# Patient Record
Sex: Female | Born: 1985 | Hispanic: Yes | Marital: Married | Smoking: Never smoker
Health system: Southern US, Community
[De-identification: ages and names within clinical notes are randomized; demographics above are authoritative.]

## PROBLEM LIST (undated history)

## (undated) DIAGNOSIS — N6452 Nipple discharge: Secondary | ICD-10-CM

## (undated) DIAGNOSIS — N6019 Diffuse cystic mastopathy of unspecified breast: Secondary | ICD-10-CM

## (undated) HISTORY — PX: BREAST BIOPSY: SHX20

---
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
## 2006-04-15 ENCOUNTER — Inpatient Hospital Stay: Payer: Self-pay | Admitting: Obstetrics and Gynecology

## 2006-05-17 ENCOUNTER — Emergency Department: Payer: Self-pay | Admitting: Unknown Physician Specialty

## 2008-05-25 ENCOUNTER — Emergency Department: Payer: Self-pay | Admitting: Emergency Medicine

## 2015-10-05 ENCOUNTER — Other Ambulatory Visit: Payer: Self-pay

## 2015-10-05 ENCOUNTER — Ambulatory Visit
Admission: RE | Admit: 2015-10-05 | Discharge: 2015-10-05 | Disposition: A | Discharge status: Home / Self Care | Payer: Self-pay | Source: Ambulatory Visit | Attending: Oncology | Admitting: Oncology

## 2015-10-05 ENCOUNTER — Ambulatory Visit: Payer: Self-pay | Attending: Oncology

## 2015-10-05 VITALS — BP 127/72 | HR 82 | Temp 98.6°F | Resp 18 | Ht 64.96 in | Wt 128.3 lb

## 2015-10-05 DIAGNOSIS — N63 Unspecified lump in unspecified breast: Secondary | ICD-10-CM

## 2015-10-05 NOTE — Progress Notes (Signed)
Subjective:     Patient ID: Sally Liu, female   DOB: 02/08/1986, 30 y.o.   MRN: [id number]  HPI   Review of Systems     Objective:   Physical Exam  Pulmonary/Chest:    Firm, mobile 2cm. mass at 1 o'clock       Assessment:     30 year old female referred to [facility] by  Nurse practitioner at [facility] for right breast mass.  Patient's mother passed away 2 months ago from triple negative, BRCA negative breast cancer.  Patient states she felt lump 1-2 weeks ago.   CBE reveals firm, mobile mass at 1 o'clock right breast.  Bilateral thickening upper outer quadrants bilateral.      Plan:     Sent patient for bilateral diagnostic mammogram and bilateral breast ultrasound.

## 2015-10-11 ENCOUNTER — Telehealth: Payer: Self-pay | Admitting: *Deleted

## 2015-10-18 ENCOUNTER — Ambulatory Visit
Admission: RE | Admit: 2015-10-18 | Discharge: 2015-10-18 | Disposition: A | Discharge status: Home / Self Care | Payer: Self-pay | Source: Ambulatory Visit | Attending: Oncology | Admitting: Oncology

## 2015-10-18 ENCOUNTER — Other Ambulatory Visit: Payer: Self-pay

## 2015-10-18 DIAGNOSIS — N63 Unspecified lump in unspecified breast: Secondary | ICD-10-CM

## 2015-10-19 LAB — SURGICAL PATHOLOGY

## 2015-10-21 NOTE — Progress Notes (Signed)
Phoned patient with ultrasound results. States she does not want to follow-up with surgeon at this time to remove fibroadenoma.  States radiologist explained if it gets larger sh may want to have removed.  She is to call back with any concerns. Copy to [facility].

## 2015-10-25 ENCOUNTER — Other Ambulatory Visit: Payer: Self-pay

## 2015-10-25 DIAGNOSIS — N63 Unspecified lump in unspecified breast: Secondary | ICD-10-CM

## 2015-10-27 NOTE — Progress Notes (Signed)
Letter mailed to patient to notify of scheduled 6 month follow-up right breast ultrasound on 04/19/16 at 1:00 at the [facility]. Marland Kitchen

## 2016-04-19 ENCOUNTER — Ambulatory Visit: Payer: Self-pay | Attending: Oncology

## 2016-07-18 ENCOUNTER — Ambulatory Visit
Admission: RE | Admit: 2016-07-18 | Discharge: 2016-07-18 | Disposition: A | Discharge status: Home / Self Care | Payer: Self-pay | Source: Ambulatory Visit | Attending: Oncology | Admitting: Oncology

## 2016-07-18 DIAGNOSIS — N63 Unspecified lump in unspecified breast: Secondary | ICD-10-CM

## 2016-07-19 NOTE — Progress Notes (Signed)
The radiologist recommends a six month follow- up ultrasound of  right breast. Patient will be due for annual [facility] screening for eligibility. Scheduled a [facility] appointment for Wednesday Jan 16, 2017 at 12:30.  [facility] translation.

## 2017-01-16 ENCOUNTER — Ambulatory Visit: Payer: Self-pay | Attending: Oncology

## 2017-04-17 ENCOUNTER — Ambulatory Visit: Payer: Self-pay | Attending: Oncology | Admitting: *Deleted

## 2017-04-17 ENCOUNTER — Encounter: Payer: Self-pay | Admitting: *Deleted

## 2017-04-17 VITALS — BP 121/77 | HR 65 | Temp 98.0°F | Ht 65.0 in | Wt 136.0 lb

## 2017-04-17 DIAGNOSIS — N63 Unspecified lump in unspecified breast: Secondary | ICD-10-CM

## 2017-04-17 NOTE — Progress Notes (Signed)
Subjective:     Patient ID: Zannie KehrSilsa L Alfredo, female   DOB: 01-22-1986, 31 y.o.   MRN: [id number]  HPI   Review of Systems     Objective:   Physical Exam  Pulmonary/Chest: Right breast exhibits mass and nipple discharge. Right breast exhibits no inverted nipple, no skin change and no tenderness. Left breast exhibits no inverted nipple, no mass, no nipple discharge, no skin change and no tenderness. Breasts are symmetrical.         Assessment:     31 year old English speaking Hispanic [facility] and annual exam.  Patient with history of a benign breast biopsy of a 1:00 right breast mass on 10/18/15.  A fibroadenoma is noted.  Family history of breast cancer in her mom at age 30.  Patient states she has been experiencing clear nipple discharge on expression only for the last 3 weeks.  States "I work at a [employer] and the temperature gets to 120 degrees.  States when I get home and get in the shower, when I check myself, clear discharge comes out of the right nipple.  States I put a tissue over the discharge to see what color it is."  Denies spontaneous discharge.  On clinical breast exam I can palpate an approximate 2 cm nodule at 1:00 right breast.  This is the same nodule that was biopsied and proven to be a fibroadenoma.  No discharge noted on exam, and the patient was not able to elicit any discharge.  States "I think it has something to do with the hot temperature that I work in".  Taught self breast awareness.  Patient has been screened for eligibility.  She does not have any insurance, Medicare or Medicaid.  She also meets financial eligibility.  Hand-out given on the Affordable Care Act.    Plan:     Right breast ultrasound ordered.  If no new findings on imaging will refer for surgical consult for the nipple discharge.  Patient is agreeable to the plan.  Will follow up per [facility] protocol.

## 2017-04-17 NOTE — Patient Instructions (Signed)
Gave patient hand-out, Women Staying Healthy, Active and Well from [facility], with education on breast health, pap smears, heart and colon health. 

## 2017-05-01 ENCOUNTER — Other Ambulatory Visit: Payer: Self-pay

## 2017-05-08 ENCOUNTER — Other Ambulatory Visit: Payer: Self-pay

## 2017-05-23 ENCOUNTER — Ambulatory Visit: Payer: Self-pay | Attending: Oncology

## 2017-06-18 ENCOUNTER — Ambulatory Visit
Admission: RE | Admit: 2017-06-18 | Discharge: 2017-06-18 | Disposition: A | Discharge status: Home / Self Care | Payer: Self-pay | Source: Ambulatory Visit | Attending: Oncology | Admitting: Oncology

## 2017-06-18 DIAGNOSIS — N63 Unspecified lump in unspecified breast: Secondary | ICD-10-CM

## 2017-07-02 ENCOUNTER — Telehealth: Payer: Self-pay | Admitting: *Deleted

## 2017-07-02 NOTE — Telephone Encounter (Signed)
Called patient today to review status of her previous nipple discharge.  States she has not expressed any discharge since talking to the radiologist.  She is to call if the discharge returns and is spontaneous, or she notices any new masses.  Screening mammograms to begin at age 31 due to family history of her mom's breast cancer at age 73.  She is agreeable to the plan.

## 2018-09-30 ENCOUNTER — Other Ambulatory Visit: Payer: Self-pay | Admitting: Physician Assistant

## 2018-09-30 DIAGNOSIS — Z3482 Encounter for supervision of other normal pregnancy, second trimester: Secondary | ICD-10-CM

## 2018-10-10 ENCOUNTER — Other Ambulatory Visit: Payer: Self-pay | Admitting: Physician Assistant

## 2018-10-10 DIAGNOSIS — Z3481 Encounter for supervision of other normal pregnancy, first trimester: Secondary | ICD-10-CM

## 2018-10-16 ENCOUNTER — Other Ambulatory Visit: Payer: Self-pay

## 2018-10-21 ENCOUNTER — Encounter (HOSPITAL_COMMUNITY): Payer: Self-pay

## 2018-10-21 ENCOUNTER — Other Ambulatory Visit: Payer: Self-pay | Admitting: Physician Assistant

## 2018-10-21 ENCOUNTER — Ambulatory Visit
Admission: RE | Admit: 2018-10-21 | Discharge: 2018-10-21 | Disposition: A | Discharge status: Home / Self Care | Payer: Medicaid Other | Source: Ambulatory Visit | Attending: Physician Assistant | Admitting: Physician Assistant

## 2018-10-21 DIAGNOSIS — Z3481 Encounter for supervision of other normal pregnancy, first trimester: Secondary | ICD-10-CM

## 2018-10-21 HISTORY — DX: Diffuse cystic mastopathy of unspecified breast: N60.19

## 2018-10-21 HISTORY — DX: Nipple discharge: N64.52

## 2018-11-04 ENCOUNTER — Ambulatory Visit
Admission: RE | Admit: 2018-11-04 | Discharge: 2018-11-04 | Disposition: A | Discharge status: Home / Self Care | Payer: Medicaid Other | Source: Ambulatory Visit | Attending: Physician Assistant | Admitting: Physician Assistant

## 2018-11-04 DIAGNOSIS — Z3482 Encounter for supervision of other normal pregnancy, second trimester: Secondary | ICD-10-CM | POA: Insufficient documentation

## 2019-02-26 IMAGING — US ULTRASOUND RIGHT BREAST LIMITED
1 series · 5 of 5 positions shown · non-contrast
Comparison: Previous ultrasounds since September 2015.

CLINICAL DATA: The patient presents with pain in her right axilla
and bloody nipple discharge. The patient is pregnant.

EXAM:
DIGITAL DIAGNOSTIC RIGHT MAMMOGRAM
ULTRASOUND RIGHT BREAST

[Series 1: ultrasound right breast limited · 0.06mm/px · 5 of 5 slices shown]
[im 1/5]
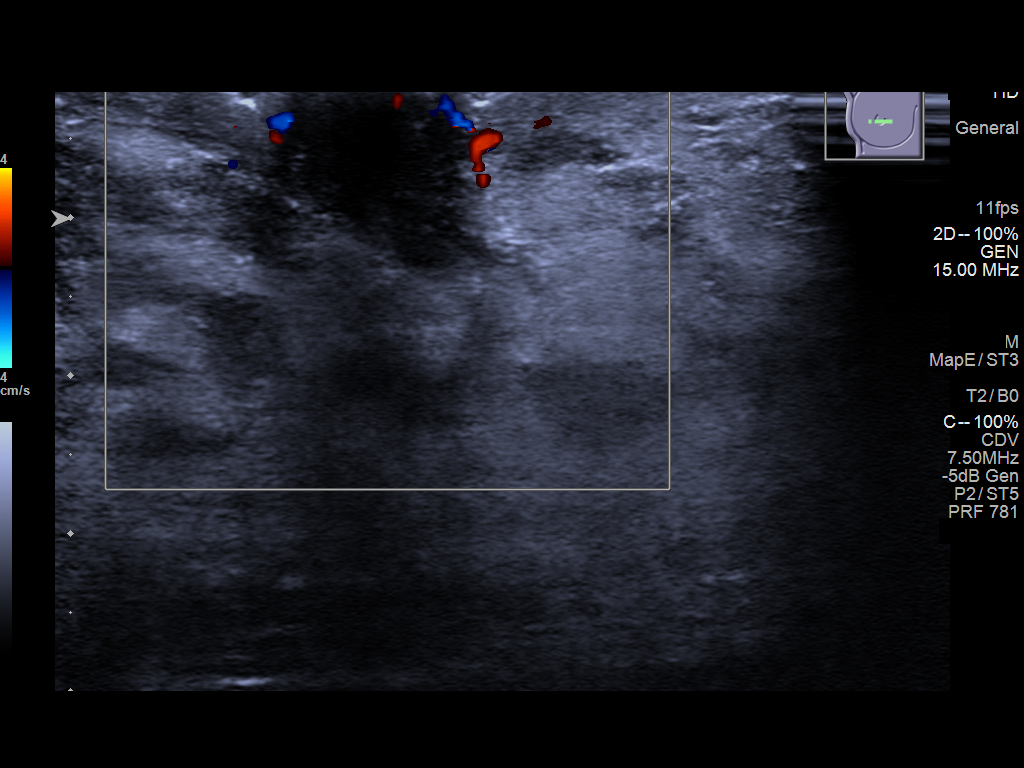
[im 2/5]
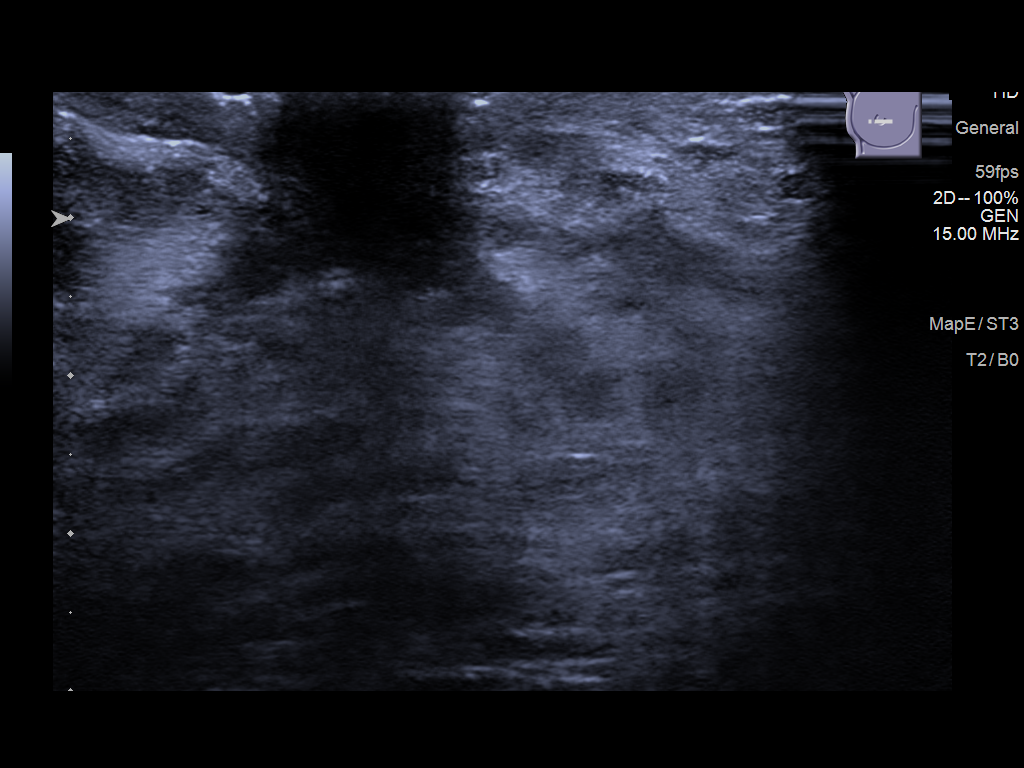
[im 3/5]
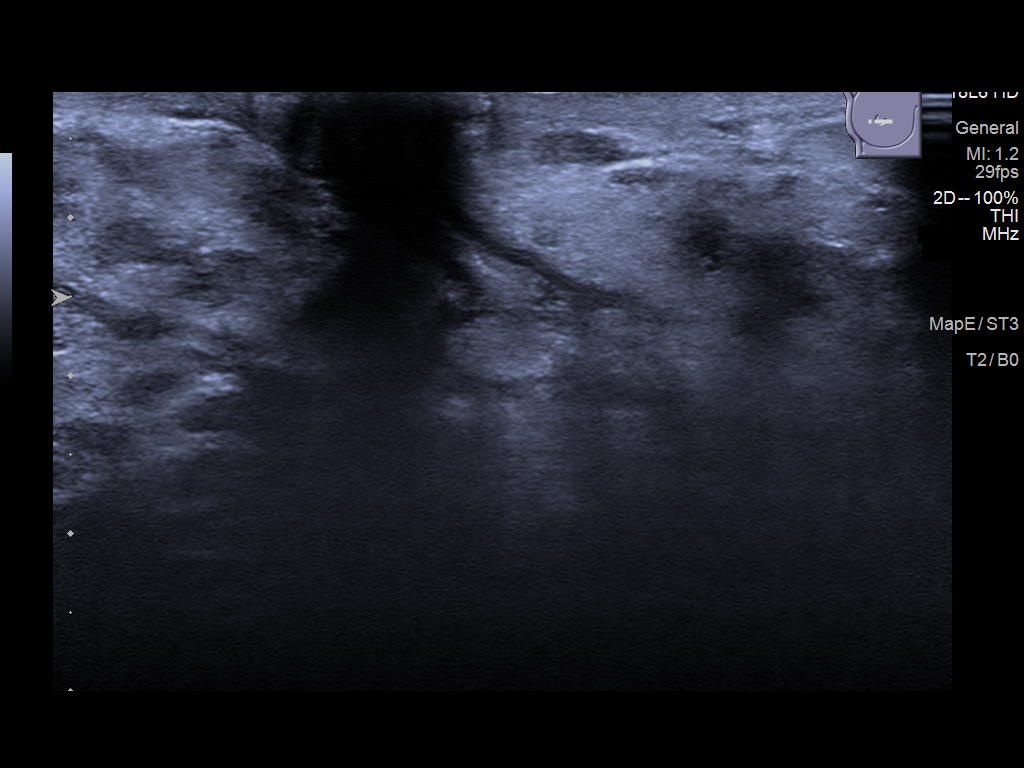
[im 4/5]
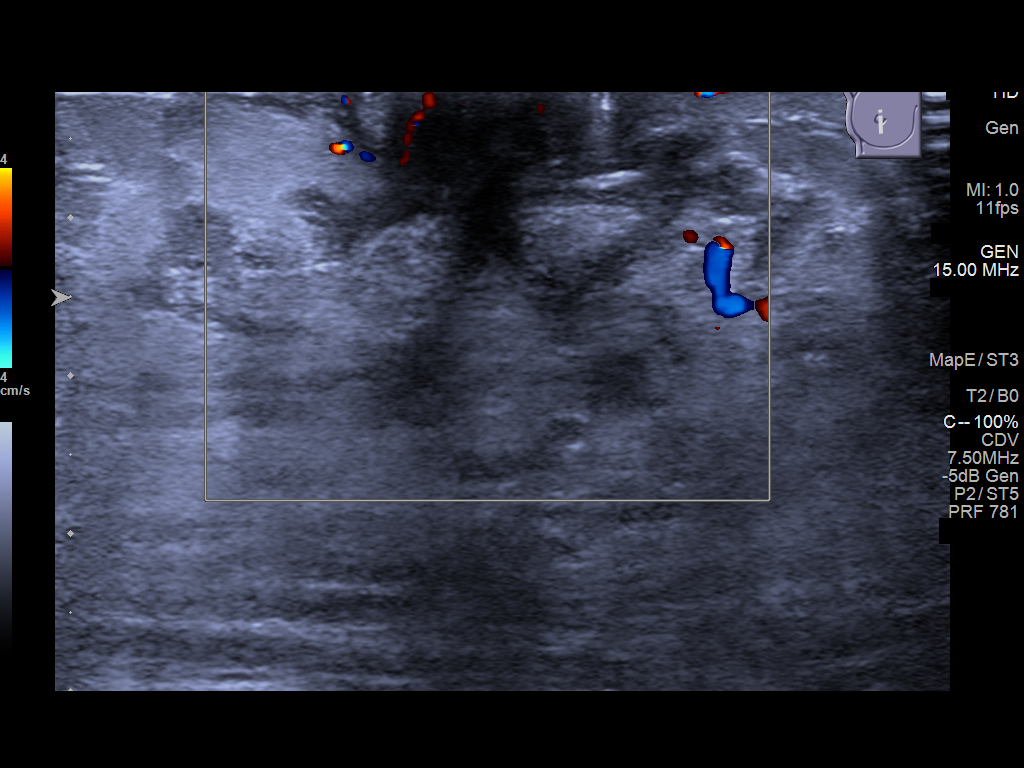
[im 5/5]
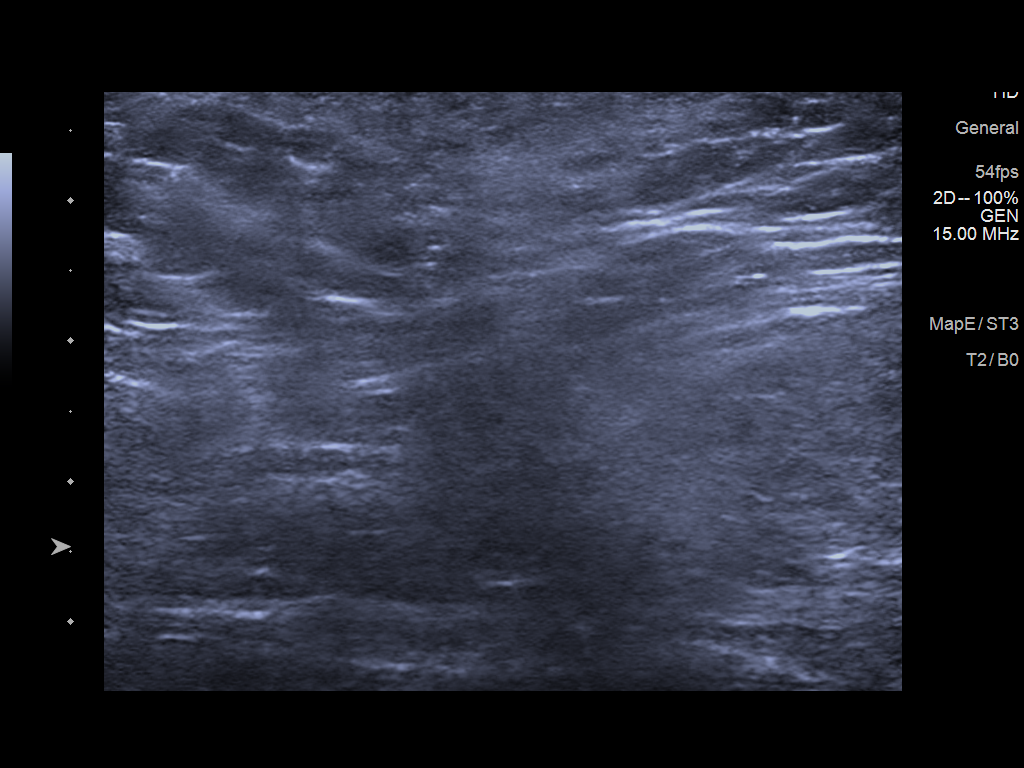

[5 of 5 positions shown; findings below may reference images not displayed]

The patient
has had no previous mammograms.

ACR Breast Density Category c: The breast tissue is heterogeneously
dense, which may obscure small masses.
FINDINGS: Mammography was performed after a negative ultrasound. No evidence
of malignancy in the right breast on today's spot images.
Specifically, no evidence of calcified DCIS behind the nipple. There
is a biopsy clip which correlates with a previously biopsied
fibroadenoma.

On physical exam, no suspicious lumps are identified.

Targeted ultrasound is performed, showing no sonographic
abnormalities behind the right nipple. The patient appears to have
glandular tissue in her axillary region, likely explaining the
intermittent pain in this region. She has no symptoms in this region
today.
IMPRESSION: No mammographic or sonographic evidence of malignancy. No cause for
the patient's spontaneous bloody right nipple discharge.

RECOMMENDATION:
Recommend surgical consultation for the patient's bloody nipple
discharge. Recommend breast MRI after pregnancy to assess for
underlying papilloma or less likely malignancy. Recommend annual
screening mammography beginning at the age of 40.

I have discussed the findings and recommendations with the patient.
Results were also provided in writing at the conclusion of the
visit. If applicable, a reminder letter will be sent to the patient
regarding the next appointment.

BI-RADS CATEGORY  2: Benign.

## 2019-03-12 IMAGING — US US OB COMP +14 WK
2 series · 13 of 28 positions shown · non-contrast
Comparison: none

CLINICAL DATA: Second trimester pregnancy.

EXAM:
OBSTETRICAL ULTRASOUND >14 WKS

[Series 1: us ob comp +14 wk · 10 of 87 slices shown (1 of 2)]
[im 5/87]
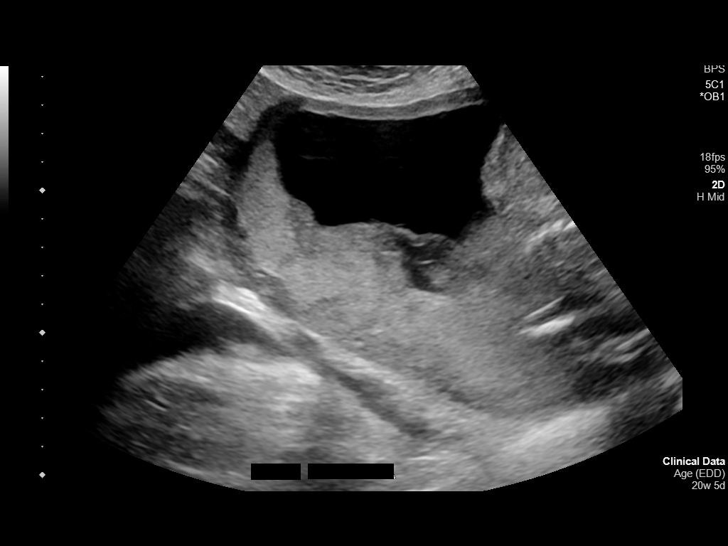
[im 13/87]
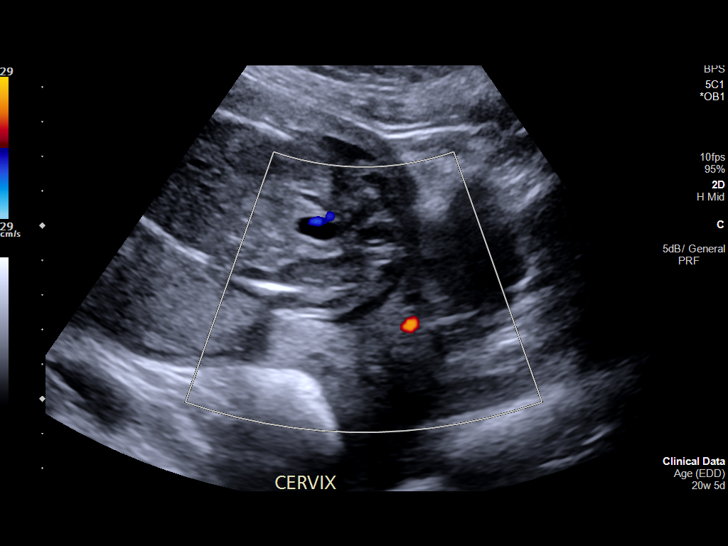
[im 21/87]
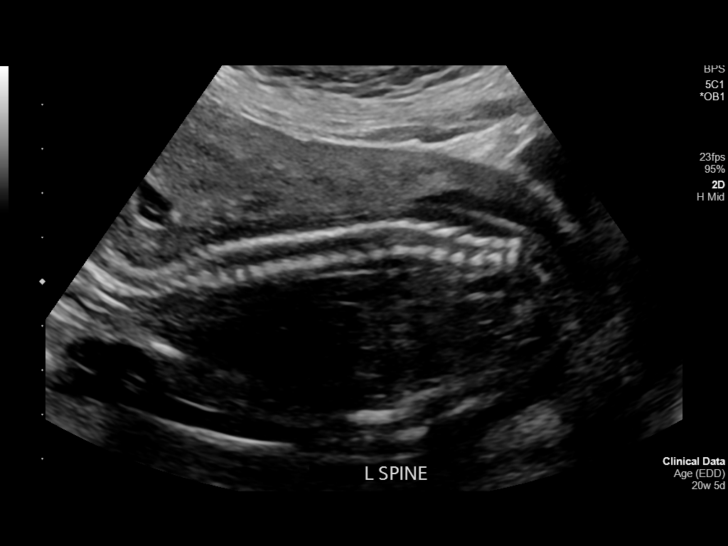
[im 29/87]
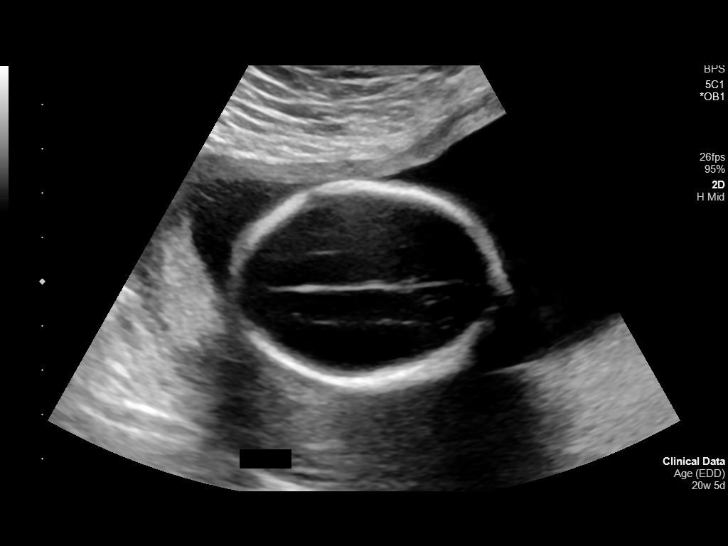
[im 37/87]
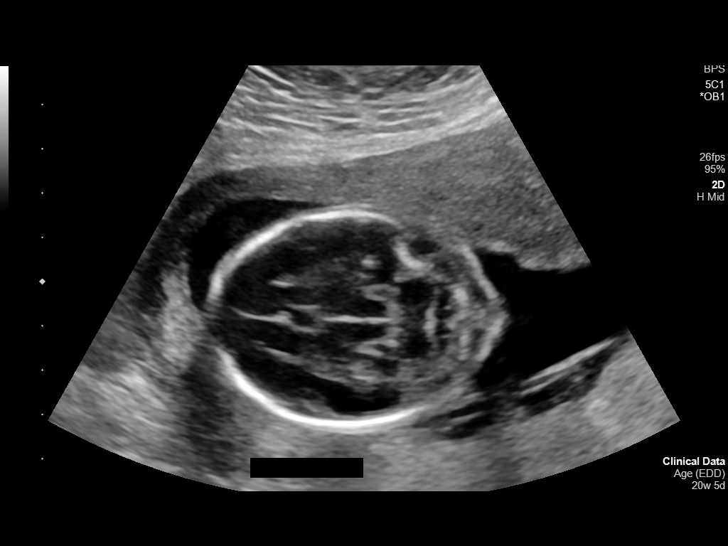
[im 46/87]
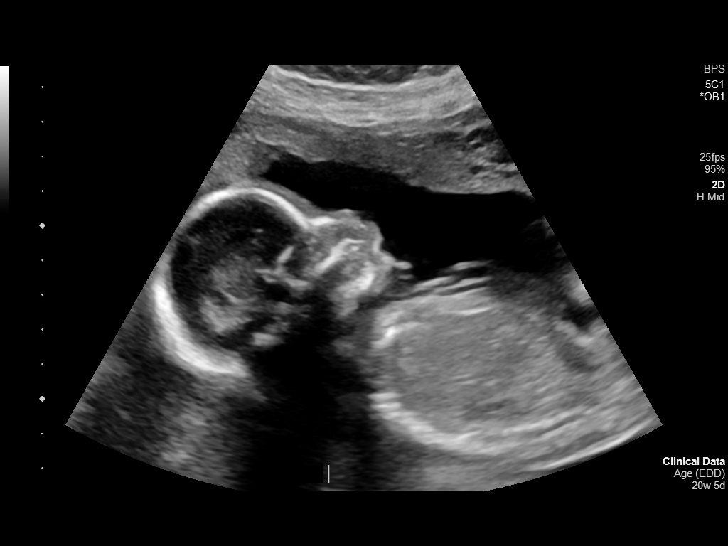
[im 58/87]
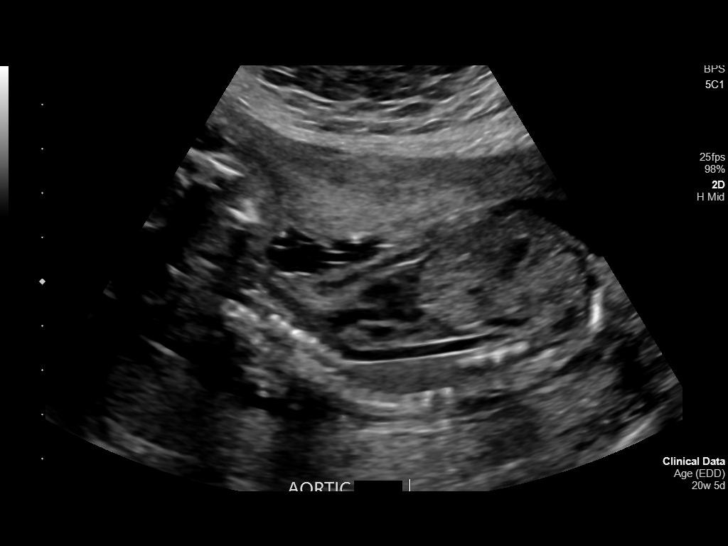
[im 66/87]
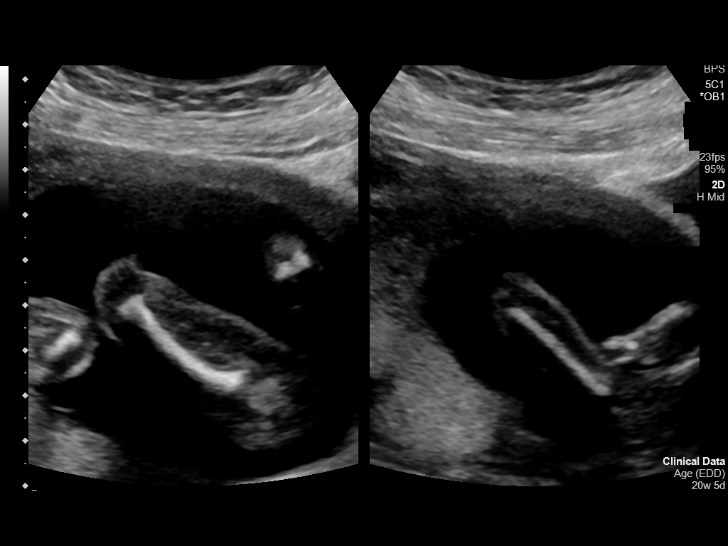
[im 74/87]
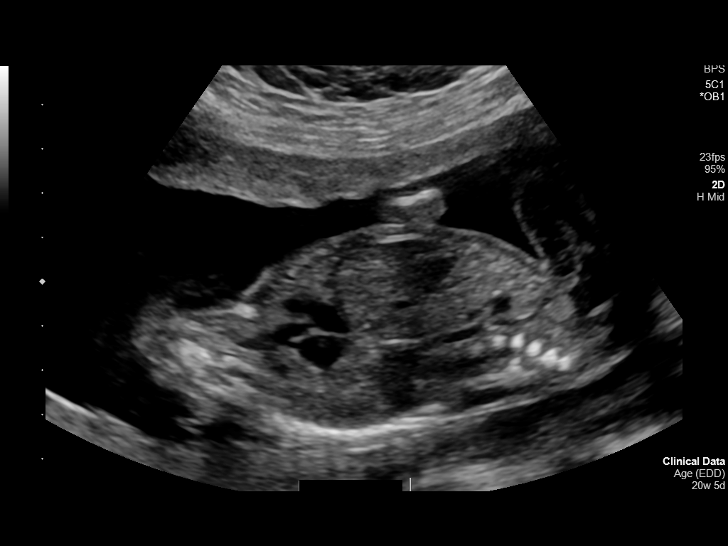
[im 82/87]
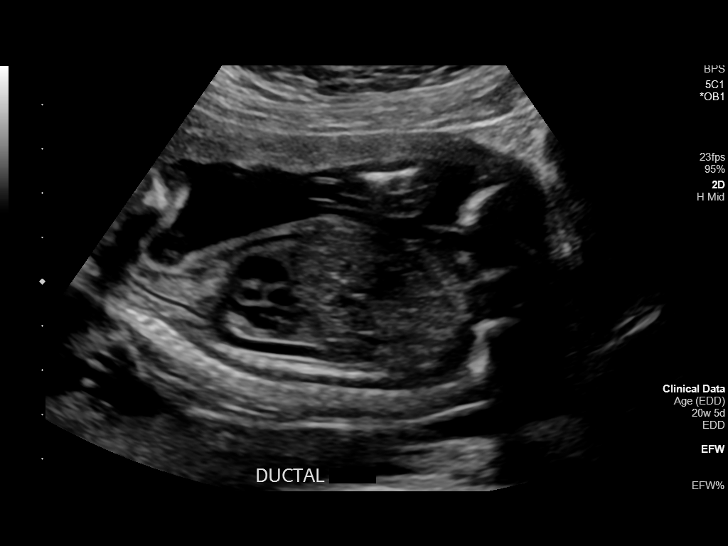

[Series 1001: us ob comp +14 wk · 3 of 22 slices shown (2 of 2)]
[im 1/22]
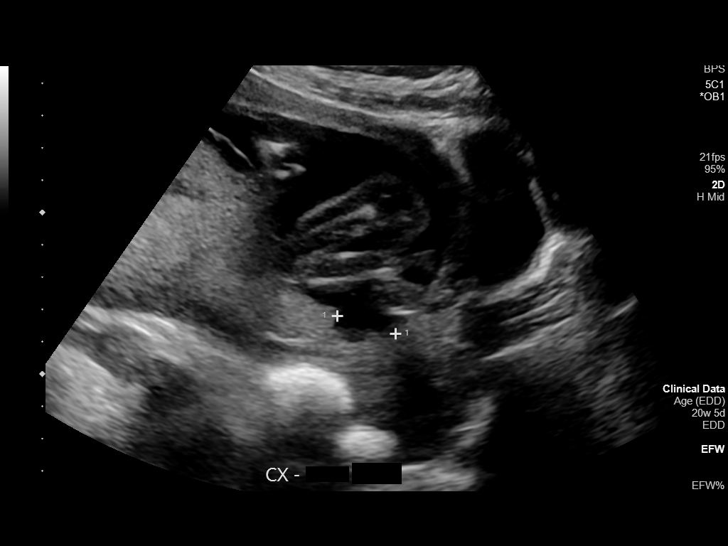
[im 9/22]
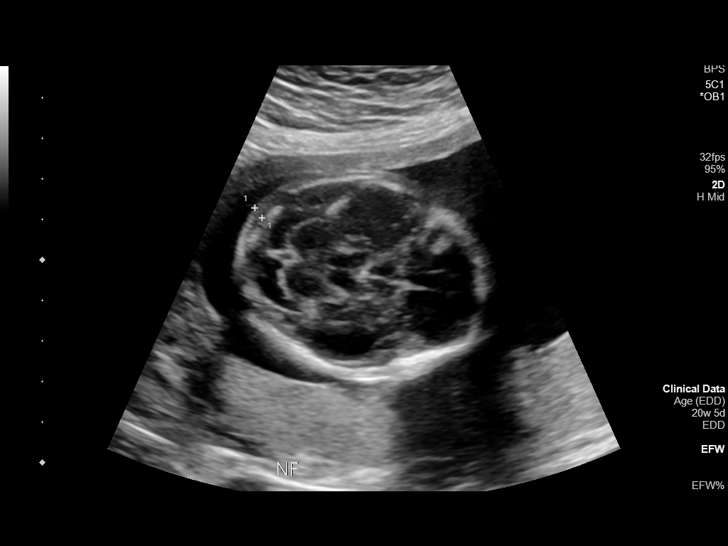
[im 17/22]
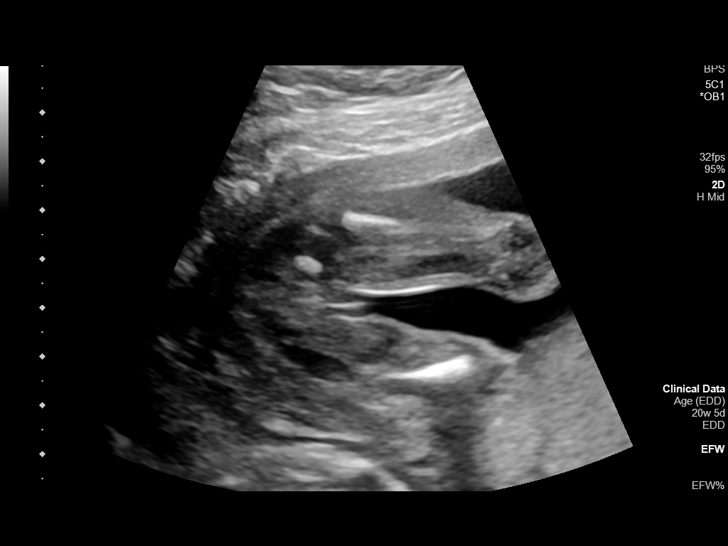

[13 of 28 positions shown; findings below may reference images not displayed]

FINDINGS: Number of Fetuses: 1

Heart Rate:  154 bpm

Movement: Present

Presentation: Breech/variable

Previa: Marginal placenta noted.

Placental Location: Posterior

Amniotic Fluid (Subjective): Normal

Amniotic Fluid (Objective):

Vertical pocket = 5.3cm

FETAL BIOMETRY

BPD: 4.8cm 20w 3d

HC:   18.0cm 20w 3d

AC:   15.3cm 20w 4d

FL:   3.3cm 20w 3d

Current Mean GA: 20w 6d US EDC: 03/18/2019

Assigned GA:  20w 5d Assigned EDC: 03/19/2019

FETAL ANATOMY

Lateral Ventricles: Appears normal

Thalami/CSP: Appears normal

Posterior Fossa:  Appears normal

Nuchal Region: Appears normal NFT= 3.0 mm

Upper Lip: Appears normal

Spine: Appears normal

4 Chamber Heart on Left: Appears normal

LVOT: Appears normal

RVOT: Appears normal

Stomach on Left: Appears normal

3 Vessel Cord: Appears normal

Cord Insertion site: Appears normal

Kidneys: Appears normal

Bladder: Appears normal

Extremities: Appears normal

Maternal Findings:

Cervix:  3.2 cm and closed
IMPRESSION: 1. Single viable intrauterine pregnancy at 20 weeks 6 days.
Breech/variable presentation.

2.  Marginal posterior placenta.  Follow-up exam suggested.

## 2024-01-31 ENCOUNTER — Ambulatory Visit
Admission: EM | Admit: 2024-01-31 | Discharge: 2024-01-31 | Disposition: A | Discharge status: Home / Self Care | Attending: Emergency Medicine | Admitting: Emergency Medicine

## 2024-01-31 DIAGNOSIS — M436 Torticollis: Secondary | ICD-10-CM

## 2024-01-31 MED ORDER — METHOCARBAMOL 500 MG PO TABS: 500.0000 mg | ORAL_TABLET | Freq: Two times a day (BID) | ORAL | 0 refills | Status: AC | PRN

## 2024-01-31 NOTE — ED Provider Notes (Signed)
 [name]    CSN: [id number] Arrival date & time: 01/31/24  1414      History   Chief Complaint Chief Complaint  Patient presents with   Headache    HPI Sally Liu is a 38 y.o. female.  Patient presents with muscular tenderness and spasm in the left side of her neck x 1 week.  No falls or injury.  She woke up with her symptoms and attributes them to her pillow.  She has been treating the pain with Tylenol; last taken this morning at 0830.  No numbness, weakness, fever, rash, chest pain, shortness of breath.  The history is provided by the patient and medical records.    Past Medical History:  Diagnosis Date   Breast discharge    Fibrocystic breast     There are no active problems to display for this patient.   Past Surgical History:  Procedure Laterality Date   BREAST BIOPSY      OB History     Gravida  1   Para      Term      Preterm      AB      Living         SAB      IAB      Ectopic      Multiple      Live Births               Home Medications    Prior to Admission medications   Medication Sig Start Date End Date Taking? Authorizing Provider  methocarbamol (ROBAXIN) 500 MG tablet Take 1 tablet (500 mg total) by mouth 2 (two) times daily as needed for muscle spasms. 01/31/24  Yes Wellington Half, NP    Family History Family History  Problem Relation Age of Onset   Breast cancer Mother 76    Social History Social History   Tobacco Use   Smoking status: Never   Smokeless tobacco: Never  Substance Use Topics   Alcohol use: No    Alcohol/week: 0.0 standard drinks of alcohol   Drug use: No     Allergies   Patient has no known allergies.   Review of Systems Review of Systems  Constitutional:  Negative for chills and fever.  HENT:  Negative for ear pain and sore throat.   Respiratory:  Negative for cough and shortness of breath.   Cardiovascular:  Negative for chest pain and palpitations.   Musculoskeletal:  Positive for myalgias, neck pain and neck stiffness. Negative for arthralgias, gait problem and joint swelling.  Skin:  Negative for color change, rash and wound.  Neurological:  Negative for weakness and numbness.     Physical Exam Triage Vital Signs ED Triage Vitals  Encounter Vitals Group     BP 01/31/24 1504 118/81     Systolic BP Percentile --      Diastolic BP Percentile --      Pulse Rate 01/31/24 1504 68     Resp 01/31/24 1504 18     Temp 01/31/24 1504 98.2 F (36.8 C)     Temp src --      SpO2 01/31/24 1504 98 %     Weight --      Height --      Head Circumference --      Peak Flow --      Pain Score 01/31/24 1456 7     Pain Loc --  Pain Education --      Exclude from Growth Chart --    No data found.  Updated Vital Signs BP 118/81   Pulse 68   Temp 98.2 F (36.8 C)   Resp 18   LMP 01/24/2024   SpO2 98%   Visual Acuity Right Eye Distance:   Left Eye Distance:   Bilateral Distance:    Right Eye Near:   Left Eye Near:    Bilateral Near:     Physical Exam Constitutional:      General: She is not in acute distress. HENT:     Right Ear: Tympanic membrane normal.     Left Ear: Tympanic membrane normal.     Nose: Nose normal.     Mouth/Throat:     Mouth: Mucous membranes are moist.     Pharynx: Oropharynx is clear.  Cardiovascular:     Rate and Rhythm: Normal rate and regular rhythm.     Heart sounds: Normal heart sounds.  Pulmonary:     Effort: Pulmonary effort is normal. No respiratory distress.     Breath sounds: Normal breath sounds.  Musculoskeletal:        General: Tenderness present. No swelling or deformity. Normal range of motion.     Comments: Mild tenderness to palpation of the muscles on the left side of neck.  FROM.  Spine nontender.  Skin:    General: Skin is warm and dry.     Capillary Refill: Capillary refill takes less than 2 seconds.     Findings: No bruising, erythema, lesion or rash.  Neurological:      General: No focal deficit present.     Mental Status: She is alert.     Sensory: No sensory deficit.     Motor: No weakness.     Gait: Gait normal.      UC Treatments / Results  Labs (all labs ordered are listed, but only abnormal results are displayed) Labs Reviewed - No data to display  EKG   Radiology No results found.  Procedures Procedures (including critical care time)  Medications Ordered in UC Medications - No data to display  Initial Impression / Assessment and Plan / UC Course  I have reviewed the triage vital signs and the nursing notes.  Pertinent labs & imaging results that were available during my care of the patient were reviewed by me and considered in my medical decision making (see chart for details).    Torticollis.  Afebrile and vital signs are stable.  Treating with ibuprofen and methocarbamol.  Precautions for drowsiness with methocarbamol discussed.  Discussed warm compresses, gentle stretching, gentle massage.  Education provided on torticollis.  Instructed patient to follow-up with her PCP on Monday.  ED precautions given.  She agrees to plan of care.  Final Clinical Impressions(s) / UC Diagnoses   Final diagnoses:  Torticollis     Discharge Instructions      Follow up with your primary care provider on Monday.  Go to the emergency department if you have worsening symptoms.    Take ibuprofen as needed for discomfort.  Take the muscle relaxer as needed for muscle spasm; Do not drive, operate machinery, or drink alcohol with this medication as it can cause drowsiness.     ED Prescriptions     Medication Sig Dispense Auth. Provider   methocarbamol (ROBAXIN) 500 MG tablet Take 1 tablet (500 mg total) by mouth 2 (two) times daily as needed for muscle spasms. 10 tablet Arabella Knife,  Adonis Hoot, NP      I have reviewed the PDMP during this encounter.   Wellington Half, NP 01/31/24 [phone]

## 2024-01-31 NOTE — ED Triage Notes (Signed)
 Patient to Urgent Care with complaints of "severe" right sided neck pain and headaches. Denies any known injury or cause for pain.  Symptoms started 10 days ago.   Meds: tylenol

## 2024-01-31 NOTE — Discharge Instructions (Addendum)
 Follow up with your primary care provider on Monday.  Go to the emergency department if you have worsening symptoms.    Take ibuprofen as needed for discomfort.  Take the muscle relaxer as needed for muscle spasm; Do not drive, operate machinery, or drink alcohol with this medication as it can cause drowsiness.
